# Patient Record
Sex: Female | Born: 1935 | Race: White | Hispanic: No | Marital: Married
Health system: Southern US, Community
[De-identification: ages and names within clinical notes are randomized; demographics above are authoritative.]

---

## 1999-03-18 ENCOUNTER — Encounter: Admission: RE | Admit: 1999-03-18 | Discharge: 1999-03-18 | Payer: Self-pay | Admitting: Neurosurgery

## 2003-07-24 ENCOUNTER — Other Ambulatory Visit: Payer: Self-pay

## 2004-06-09 ENCOUNTER — Ambulatory Visit: Payer: Self-pay | Admitting: Internal Medicine

## 2004-07-28 ENCOUNTER — Ambulatory Visit: Payer: Self-pay | Admitting: Ophthalmology

## 2004-07-28 ENCOUNTER — Other Ambulatory Visit: Payer: Self-pay

## 2004-08-02 ENCOUNTER — Ambulatory Visit: Payer: Self-pay | Admitting: Internal Medicine

## 2004-08-17 ENCOUNTER — Ambulatory Visit: Payer: Self-pay | Admitting: Ophthalmology

## 2005-07-21 ENCOUNTER — Ambulatory Visit: Payer: Self-pay | Admitting: Neurology

## 2006-06-02 ENCOUNTER — Other Ambulatory Visit: Payer: Self-pay

## 2006-06-02 ENCOUNTER — Inpatient Hospital Stay: Payer: Self-pay | Admitting: Unknown Physician Specialty

## 2006-06-04 ENCOUNTER — Other Ambulatory Visit: Payer: Self-pay

## 2006-06-07 ENCOUNTER — Encounter: Payer: Self-pay | Admitting: Internal Medicine

## 2006-06-14 ENCOUNTER — Encounter: Payer: Self-pay | Admitting: Internal Medicine

## 2007-11-16 ENCOUNTER — Emergency Department: Payer: Self-pay | Admitting: Internal Medicine

## 2007-11-16 ENCOUNTER — Other Ambulatory Visit: Payer: Self-pay

## 2007-11-29 ENCOUNTER — Ambulatory Visit: Payer: Self-pay | Admitting: Internal Medicine

## 2009-04-27 ENCOUNTER — Inpatient Hospital Stay: Payer: Self-pay | Admitting: Internal Medicine

## 2009-05-03 ENCOUNTER — Inpatient Hospital Stay: Payer: Self-pay | Admitting: Internal Medicine

## 2009-09-10 ENCOUNTER — Inpatient Hospital Stay: Payer: Self-pay | Admitting: Internal Medicine

## 2009-12-25 IMAGING — CR DG CHEST 1V PORT
1 series · 1 of 1 positions shown · non-contrast
Comparison: none

REASON FOR EXAM: syncope
COMMENTS:

PROCEDURE:     DXR - DXR PORTABLE CHEST SINGLE VIEW  - November 16, 2007 [DATE]
RESULT:     Comparison: 06/06/2006

[view not recorded]
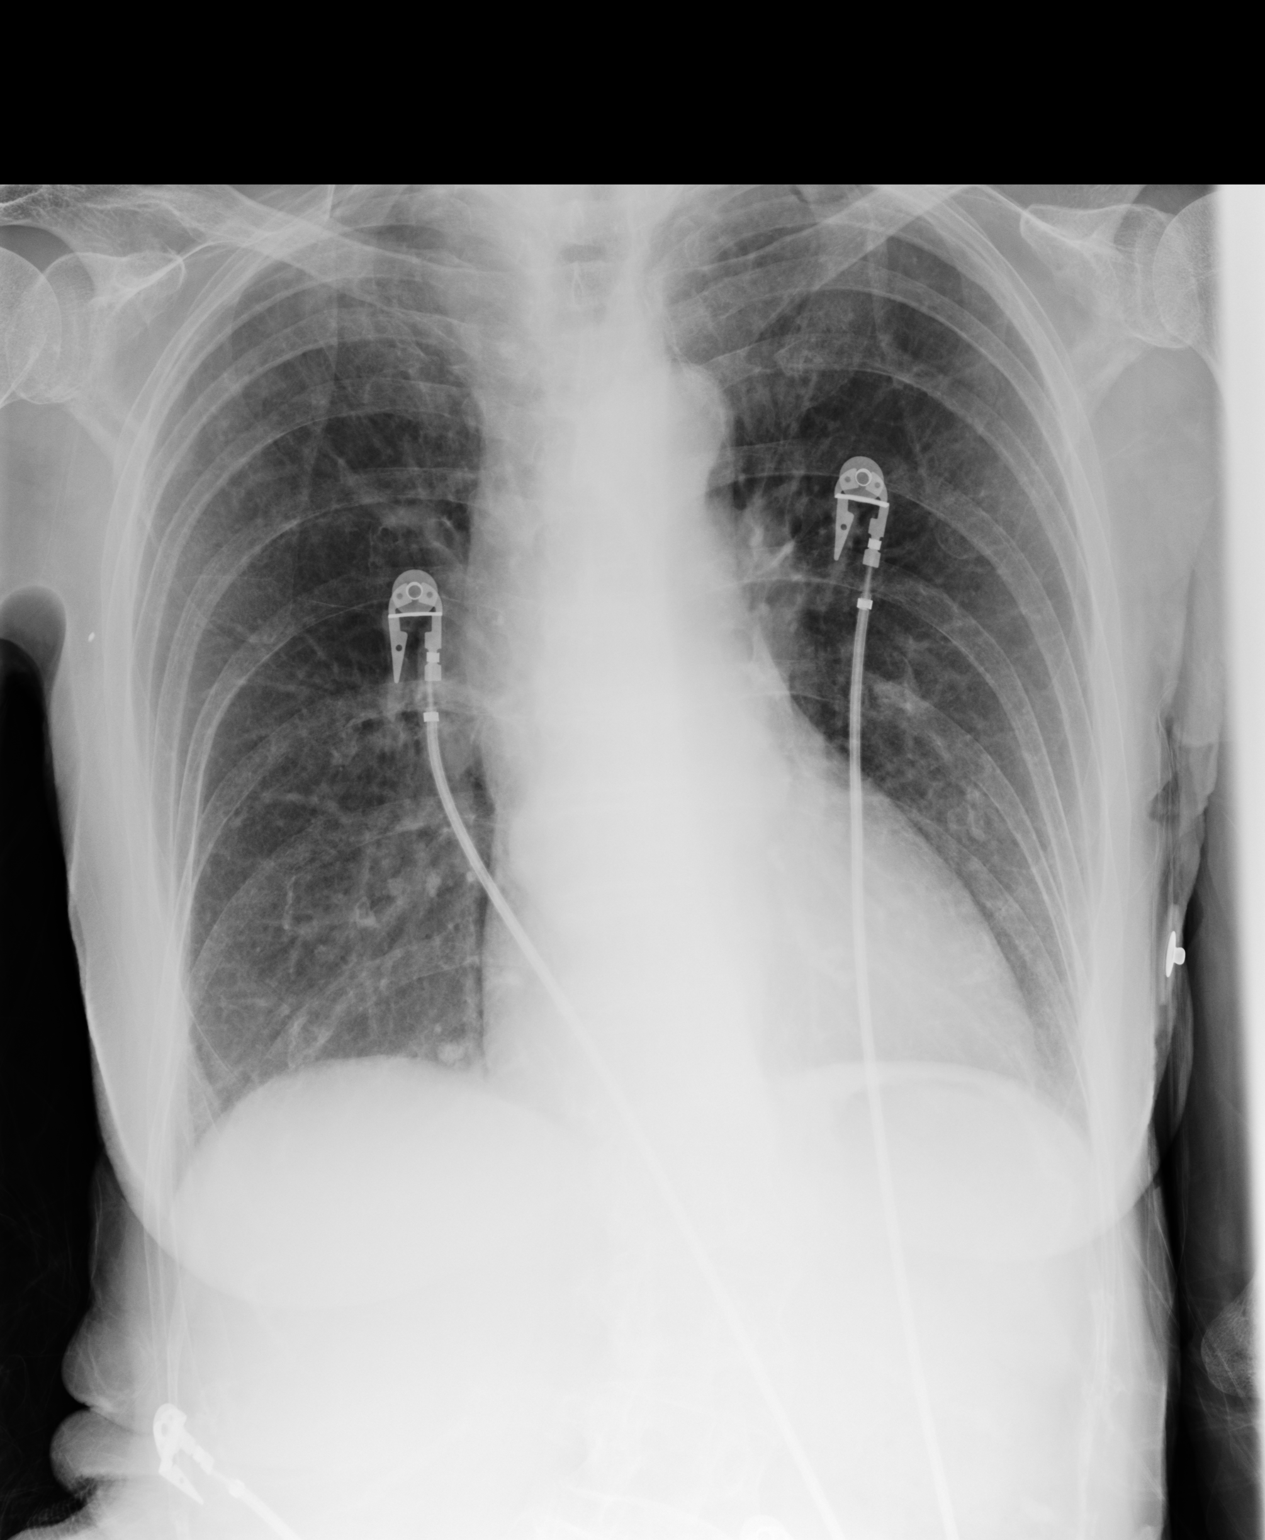

[1 of 1 positions shown; findings below may reference images not displayed]

FINDINGS: Single portable AP chest radiograph is provided. The lungs are mildly
hyperinflated. There is bilateral lower lung interstitial prominence likely
related to chronic interstitial disease. There is a pulmonary nodule in the
right medial lung base, given the density likely represents a calcified
pulmonary nodule. The nodule is unchanged compared to the prior exam. There
is no focal parenchymal opacity, pleural effusion, or pneumothorax. Normal
cardiomediastinal silhouette. The osseous structures are unremarkable.
IMPRESSION: No acute disease of the chest.

There is a pulmonary nodule in the right medial lung base, given the density
likely represents a calcified pulmonary nodule.  Recommend followup chest
radiograph in 6 months to document stability for a two-year period.

## 2010-08-22 ENCOUNTER — Ambulatory Visit: Payer: Self-pay

## 2012-06-28 ENCOUNTER — Emergency Department: Payer: Self-pay | Admitting: Emergency Medicine

## 2012-06-28 LAB — URINALYSIS, COMPLETE
Blood: NEGATIVE
Glucose,UR: NEGATIVE mg/dL (ref 0–75)
Ketone: NEGATIVE
Ph: 7 (ref 4.5–8.0)
Protein: NEGATIVE
RBC,UR: <1 /HPF (ref 0–5)
WBC UR: 1 /HPF (ref 0–5)

## 2012-06-28 LAB — COMPREHENSIVE METABOLIC PANEL
Albumin: 3.2 g/dL — ABNORMAL LOW (ref 3.4–5.0)
BUN: 20 mg/dL — ABNORMAL HIGH (ref 7–18)
Bilirubin,Total: 0.4 mg/dL (ref 0.2–1.0)
Calcium, Total: 9.1 mg/dL (ref 8.5–10.1)
Chloride: 105 mmol/L (ref 98–107)
Co2: 29 mmol/L (ref 21–32)
Creatinine: 1.09 mg/dL (ref 0.60–1.30)
Potassium: 4 mmol/L (ref 3.5–5.1)
SGOT(AST): 19 U/L (ref 15–37)
SGPT (ALT): 18 U/L (ref 12–78)
Sodium: 139 mmol/L (ref 136–145)
Total Protein: 6.8 g/dL (ref 6.4–8.2)

## 2012-06-28 LAB — CBC
HCT: 37.7 % (ref 35.0–47.0)
HGB: 12.4 g/dL (ref 12.0–16.0)
MCHC: 33 g/dL (ref 32.0–36.0)
MCV: 90 fL (ref 80–100)
RBC: 4.21 10*6/uL (ref 3.80–5.20)
RDW: 13.6 % (ref 11.5–14.5)
WBC: 6.2 10*3/uL (ref 3.6–11.0)

## 2013-01-13 ENCOUNTER — Inpatient Hospital Stay: Payer: Self-pay | Admitting: Family Medicine

## 2013-01-13 LAB — COMPREHENSIVE METABOLIC PANEL
Albumin: 2.5 g/dL — ABNORMAL LOW (ref 3.4–5.0)
Alkaline Phosphatase: 99 U/L
Anion Gap: 2 — ABNORMAL LOW (ref 7–16)
BUN: 19 mg/dL — ABNORMAL HIGH (ref 7–18)
Bilirubin,Total: 0.4 mg/dL (ref 0.2–1.0)
Calcium, Total: 8.7 mg/dL (ref 8.5–10.1)
Chloride: 97 mmol/L — ABNORMAL LOW (ref 98–107)
Co2: 31 mmol/L (ref 21–32)
Creatinine: 0.96 mg/dL (ref 0.60–1.30)
EGFR (African American): 60 — ABNORMAL LOW
EGFR (Non-African Amer.): 52 — ABNORMAL LOW
Glucose: 103 mg/dL — ABNORMAL HIGH (ref 65–99)
Osmolality: 263 (ref 275–301)
Potassium: 4.3 mmol/L (ref 3.5–5.1)
SGOT(AST): 39 U/L — ABNORMAL HIGH (ref 15–37)
SGPT (ALT): 36 U/L (ref 12–78)
Sodium: 130 mmol/L — ABNORMAL LOW (ref 136–145)
Total Protein: 6.2 g/dL — ABNORMAL LOW (ref 6.4–8.2)

## 2013-01-13 LAB — CBC WITH DIFFERENTIAL/PLATELET
Basophil #: 0.1 10*3/uL (ref 0.0–0.1)
Basophil %: 0.6 %
Eosinophil #: 0.3 10*3/uL (ref 0.0–0.7)
Eosinophil %: 2.8 %
HCT: 32.5 % — ABNORMAL LOW (ref 35.0–47.0)
HGB: 10.9 g/dL — ABNORMAL LOW (ref 12.0–16.0)
Lymphocyte #: 0.7 10*3/uL — ABNORMAL LOW (ref 1.0–3.6)
Lymphocyte %: 7.8 %
MCH: 30.4 pg (ref 26.0–34.0)
MCHC: 33.4 g/dL (ref 32.0–36.0)
MCV: 91 fL (ref 80–100)
Monocyte #: 0.9 x10 3/mm (ref 0.2–0.9)
Monocyte %: 9.3 %
Neutrophil #: 7.5 10*3/uL — ABNORMAL HIGH (ref 1.4–6.5)
Neutrophil %: 79.5 %
Platelet: 253 10*3/uL (ref 150–440)
RBC: 3.57 10*6/uL — ABNORMAL LOW (ref 3.80–5.20)
RDW: 13.5 % (ref 11.5–14.5)
WBC: 9.4 10*3/uL (ref 3.6–11.0)

## 2013-01-14 LAB — CBC WITH DIFFERENTIAL/PLATELET
Basophil #: 0.1 10*3/uL (ref 0.0–0.1)
Basophil %: 0.7 %
Eosinophil #: 0.3 10*3/uL (ref 0.0–0.7)
Eosinophil %: 4.3 %
HCT: 32.4 % — ABNORMAL LOW (ref 35.0–47.0)
HGB: 10.8 g/dL — ABNORMAL LOW (ref 12.0–16.0)
Lymphocyte #: 0.7 10*3/uL — ABNORMAL LOW (ref 1.0–3.6)
Lymphocyte %: 9 %
MCH: 30.7 pg (ref 26.0–34.0)
MCHC: 33.4 g/dL (ref 32.0–36.0)
MCV: 92 fL (ref 80–100)
Monocyte #: 0.9 x10 3/mm (ref 0.2–0.9)
Monocyte %: 11 %
Neutrophil #: 5.9 10*3/uL (ref 1.4–6.5)
Neutrophil %: 75 %
Platelet: 222 10*3/uL (ref 150–440)
RBC: 3.52 10*6/uL — ABNORMAL LOW (ref 3.80–5.20)
RDW: 13.2 % (ref 11.5–14.5)
WBC: 7.9 10*3/uL (ref 3.6–11.0)

## 2013-01-14 LAB — BASIC METABOLIC PANEL
Anion Gap: 6 — ABNORMAL LOW (ref 7–16)
BUN: 13 mg/dL (ref 7–18)
Calcium, Total: 8.5 mg/dL (ref 8.5–10.1)
Chloride: 101 mmol/L (ref 98–107)
Co2: 28 mmol/L (ref 21–32)
Creatinine: 0.83 mg/dL (ref 0.60–1.30)
EGFR (African American): >60
EGFR (Non-African Amer.): >60
Glucose: 98 mg/dL (ref 65–99)
Osmolality: 270 (ref 275–301)
Potassium: 3.9 mmol/L (ref 3.5–5.1)
Sodium: 135 mmol/L — ABNORMAL LOW (ref 136–145)

## 2013-01-15 LAB — CBC WITH DIFFERENTIAL/PLATELET
Basophil #: 0.1 10*3/uL (ref 0.0–0.1)
Basophil %: 0.7 %
Eosinophil #: 0.2 10*3/uL (ref 0.0–0.7)
Eosinophil %: 1.5 %
HCT: 33.8 % — ABNORMAL LOW (ref 35.0–47.0)
HGB: 11.4 g/dL — ABNORMAL LOW (ref 12.0–16.0)
Lymphocyte #: 0.6 10*3/uL — ABNORMAL LOW (ref 1.0–3.6)
Lymphocyte %: 5.4 %
MCH: 31 pg (ref 26.0–34.0)
MCHC: 33.8 g/dL (ref 32.0–36.0)
MCV: 92 fL (ref 80–100)
Monocyte #: 1 x10 3/mm — ABNORMAL HIGH (ref 0.2–0.9)
Monocyte %: 8.9 %
Neutrophil #: 9.1 10*3/uL — ABNORMAL HIGH (ref 1.4–6.5)
Neutrophil %: 83.5 %
Platelet: 246 10*3/uL (ref 150–440)
RBC: 3.68 10*6/uL — ABNORMAL LOW (ref 3.80–5.20)
RDW: 13.6 % (ref 11.5–14.5)
WBC: 10.9 10*3/uL (ref 3.6–11.0)

## 2013-01-15 LAB — BASIC METABOLIC PANEL
Anion Gap: 9 (ref 7–16)
BUN: 9 mg/dL (ref 7–18)
Calcium, Total: 8.3 mg/dL — ABNORMAL LOW (ref 8.5–10.1)
Chloride: 101 mmol/L (ref 98–107)
Co2: 25 mmol/L (ref 21–32)
Creatinine: 0.79 mg/dL (ref 0.60–1.30)
EGFR (African American): >60
EGFR (Non-African Amer.): >60
Glucose: 93 mg/dL (ref 65–99)
Osmolality: 268 (ref 275–301)
Potassium: 3.8 mmol/L (ref 3.5–5.1)
Sodium: 135 mmol/L — ABNORMAL LOW (ref 136–145)

## 2013-01-16 LAB — BASIC METABOLIC PANEL
Anion Gap: 4 — ABNORMAL LOW (ref 7–16)
BUN: 10 mg/dL (ref 7–18)
Chloride: 107 mmol/L (ref 98–107)
Creatinine: 0.84 mg/dL (ref 0.60–1.30)
EGFR (African American): >60
EGFR (Non-African Amer.): >60
Glucose: 98 mg/dL (ref 65–99)
Osmolality: 275 (ref 275–301)
Potassium: 3.5 mmol/L (ref 3.5–5.1)
Sodium: 138 mmol/L (ref 136–145)

## 2013-01-16 LAB — CBC WITH DIFFERENTIAL/PLATELET
Basophil #: 0.1 10*3/uL (ref 0.0–0.1)
Basophil %: 0.9 %
HGB: 10.5 g/dL — ABNORMAL LOW (ref 12.0–16.0)
Lymphocyte #: 0.7 10*3/uL — ABNORMAL LOW (ref 1.0–3.6)
Lymphocyte %: 8.4 %
MCH: 30.6 pg (ref 26.0–34.0)
MCV: 91 fL (ref 80–100)
Monocyte #: 1 x10 3/mm — ABNORMAL HIGH (ref 0.2–0.9)
Monocyte %: 11 %
Neutrophil #: 6.5 10*3/uL (ref 1.4–6.5)
Platelet: 220 10*3/uL (ref 150–440)

## 2013-01-16 LAB — CLOSTRIDIUM DIFFICILE(ARMC)

## 2013-01-16 LAB — OCCULT BLOOD X 1 CARD TO LAB, STOOL
Occult Blood, Feces: POSITIVE
Occult Blood, Feces: POSITIVE

## 2013-01-17 LAB — CBC WITH DIFFERENTIAL/PLATELET
Basophil #: 0.1 10*3/uL (ref 0.0–0.1)
Eosinophil #: 0.5 10*3/uL (ref 0.0–0.7)
Eosinophil %: 4.6 %
HCT: 32.7 % — ABNORMAL LOW (ref 35.0–47.0)
HGB: 10.9 g/dL — ABNORMAL LOW (ref 12.0–16.0)
Lymphocyte #: 0.8 10*3/uL — ABNORMAL LOW (ref 1.0–3.6)
MCH: 30.5 pg (ref 26.0–34.0)
MCHC: 33.4 g/dL (ref 32.0–36.0)
MCV: 91 fL (ref 80–100)
Monocyte #: 1 x10 3/mm — ABNORMAL HIGH (ref 0.2–0.9)
Monocyte %: 9.4 %
Neutrophil #: 8 10*3/uL — ABNORMAL HIGH (ref 1.4–6.5)
Neutrophil %: 77.2 %
Platelet: 219 10*3/uL (ref 150–440)
RDW: 13.9 % (ref 11.5–14.5)
WBC: 10.4 10*3/uL (ref 3.6–11.0)

## 2013-01-17 LAB — STOOL CULTURE

## 2013-01-18 LAB — CULTURE, BLOOD (SINGLE)

## 2013-02-23 ENCOUNTER — Emergency Department: Payer: Self-pay | Admitting: Emergency Medicine

## 2013-02-23 LAB — COMPREHENSIVE METABOLIC PANEL
ALBUMIN: 2.5 g/dL — AB (ref 3.4–5.0)
ALK PHOS: 138 U/L — AB
ALT: 34 U/L (ref 12–78)
ANION GAP: 2 — AB (ref 7–16)
AST: 43 U/L — AB (ref 15–37)
BILIRUBIN TOTAL: 0.4 mg/dL (ref 0.2–1.0)
BUN: 18 mg/dL (ref 7–18)
CO2: 32 mmol/L (ref 21–32)
CREATININE: 1 mg/dL (ref 0.60–1.30)
Calcium, Total: 8.8 mg/dL (ref 8.5–10.1)
Chloride: 103 mmol/L (ref 98–107)
EGFR (African American): 57 — ABNORMAL LOW
GFR CALC NON AF AMER: 49 — AB
GLUCOSE: 81 mg/dL (ref 65–99)
OSMOLALITY: 275 (ref 275–301)
Potassium: 4.8 mmol/L (ref 3.5–5.1)
Sodium: 137 mmol/L (ref 136–145)
Total Protein: 6.2 g/dL — ABNORMAL LOW (ref 6.4–8.2)

## 2013-02-23 LAB — CBC
HCT: 35.1 % (ref 35.0–47.0)
HGB: 11.4 g/dL — ABNORMAL LOW (ref 12.0–16.0)
MCH: 29 pg (ref 26.0–34.0)
MCHC: 32.5 g/dL (ref 32.0–36.0)
MCV: 89 fL (ref 80–100)
Platelet: 171 10*3/uL (ref 150–440)
RBC: 3.94 10*6/uL (ref 3.80–5.20)
RDW: 14.5 % (ref 11.5–14.5)
WBC: 6.7 10*3/uL (ref 3.6–11.0)

## 2013-02-23 LAB — URINALYSIS, COMPLETE
BACTERIA: NONE SEEN
Bilirubin,UR: NEGATIVE
Blood: NEGATIVE
Glucose,UR: NEGATIVE mg/dL (ref 0–75)
KETONE: NEGATIVE
LEUKOCYTE ESTERASE: NEGATIVE
NITRITE: NEGATIVE
PH: 6 (ref 4.5–8.0)
Protein: NEGATIVE
SPECIFIC GRAVITY: 1.009 (ref 1.003–1.030)
Squamous Epithelial: 1

## 2013-02-23 LAB — PROTIME-INR
INR: 1.1
PROTHROMBIN TIME: 14.4 s (ref 11.5–14.7)

## 2013-02-23 LAB — TROPONIN I: Troponin-I: 0.02 ng/mL

## 2013-03-16 DEATH — deceased

## 2014-06-05 NOTE — H&P (Signed)
PATIENT NAME:  Mandy Lane, Mandy Lane MR#:  [id number] DATE OF BIRTH:  Sep 23, 1919  DATE OF ADMISSION:  01/13/2013  CHIEF COMPLAINT: Bloody diarrhea and weakness.   HISTORY OF PRESENT ILLNESS: Mandy Lane is a 81 year old female with medical history significant for anemia with a baseline hemoglobin of 12, history of atrial fibrillation on aspirin and dementia, who presented to [facility] today for evaluation of diarrhea, bloody stool and generalized weakness. The patient is demented and lives in a nursing home. She is incontinent of bowel and bladder. Her nursing staff reports she has had two weeks of a small amount of blood in her stool. Today she was found to have a large bloody, bright red blood in her stool in her Depends. She has been more weak than usual. Appetite has been decreased. She has not had any nausea or vomiting. When asked directly, the patient states that she has had low back pain and middle back pain. She later complained of right upper quadrant abdominal pain. She generally is not very active and spends the majority of her time in a wheelchair. She has a history of hemorrhoids in the distant past. She does take an aspirin per day for prior history of atrial fibrillation. On presentation, the patient was found to be hypotensive with a sitting blood pressure of 90/60 and generalized weakness. Due to hypotension and lower GI bleed, the patient will be admitted for IV hydration, further evaluation and treatment, if need of anemia.   PAST MEDICAL HISTORY: 1.  History of degenerative arthritis.  2.  Osteoporosis.  3.  History of anemia,  last hemoglobin 12.2.  4.  Glaucoma.  5.  History of atrial fibrillation on aspirin.  6.  Anxiety.  7.  Dementia.   PAST SURGICAL HISTORY: 1.  Hemorrhoidectomy.  2.  Bilateral cataract surgeries.  3.  Surgical correction of right hip fracture in 2008.   CURRENT MEDICATIONS:  1.  Aspirin 325 mg 1 p.o. daily.  2.  Xalatan eyedrops at bedtime.  3.   Calcium plus vitamin D b.i.d.  4.  Cardizem CD 180 mg daily.  5.  Flonase two sprays per nostril once a day.  6.  Paxil 20 mg 1 p.o. at bedtime.   ALLERGIES: PENICILLIN, ARICEPT, ASPIRIN, BIAXIN AND SULFA.   SOCIAL HISTORY: The patient is demented, lives in nursing home. She is married. No tobacco history, does not drink alcohol.   CODE STATUS: The patient has no LIVING WILL in place. Her power of attorney is her son, Mandy Lane. She is DO NOT RESUSCITATE, DO NOT INTUBATE.   FAMILY HISTORY: Father deceased with pancreatic cancer. Her mother deceased at age 59 of cerebrovascular accident.   REVIEW OF SYSTEMS: All review of systems is from her family members as well as the patient when questioned directly.  GENERAL: Positive for weakness, fatigue. No fevers, chills.  HEENT: Denies nasal drainage, sore throat, visual changes.  CARDIAC: Denies chest pain, shortness of breath, dizziness, syncope, presyncope.  GASTROINTESTINAL: Per history of present illness; otherwise, denies nausea, vomiting.  GENITOURINARY: Denies dysuria or hematuria.  MUSCULOSKELETAL:  Denies joint pain stiffness, redness or swelling.  SKIN:  No change in skin.   PHYSICAL EXAMINATION: GENERAL: Elderly female in a wheelchair.  VITAL SIGNS: Weight 108, blood pressure 90/60, temperature 98.3, pulse 84 and irregular. Pulse oximetry is 88% on room air suggesting hypoxia.  HEENT: Pupils equal, round, reactive to light and accommodation. Conjunctivae are pale. Posterior pharynx is clear. Mucosa is slightly dry.  NECK: No cervical lymphadenopathy. No JVD noted.  HEART: Irregular but rate controlled with a 1/6 systolic murmur heard.  LUNGS: Diminished breath sounds throughout with some right basilar crackles heard.  ABDOMEN: Quiet bowel sounds soft, mildly tender to the right upper quadrant, nondistended. No rebound or guarding.  MUSCULOSKELETAL: No tenderness along the thoracic or lumbar vertebrae. Some tenderness in the  paraspinal muscles bilaterally.   IMPRESSION: 1.  Hypotension.  2.  Significant weakness.  3.  Lower gastrointestinal bleed.   4.  Dementia.   PLAN: 1.  The patient will be admitted for further evaluation of lower gastrointestinal bleed.  2.  We will obtain lab work including CBC Met-B.   3.  We will transfuse as needed.  4.  We will evaluate stool with Hemoccult, blood culture, stool, CNS, Clostridium difficile.  5. We will have gastroenterology evaluate. Given age and code status, unclear if she is a candidate for colonoscopy or other intervention. This will depend on symptoms and gastroenterology's opinion.  6.  We will treat with antibiotics, if need for possible diverticular bleed.  7.  We will have physical therapy evaluate for generalized weakness.   This patient was seen, re-examined, and all medical decisions were made by Dr. Dion Body.      ____________________________ Mandy Cradle, Lane-C mm:cc D: 01/13/2013 17:27:03 ET T: 01/13/2013 18:49:24 ET JOB#: [id number]  cc: Mandy Cradle, Lane-C, <Dictator> Mandy Lane ELECTRONICALLY SIGNED 01/14/2013 13:31

## 2014-06-05 NOTE — Consult Note (Signed)
Brief Consult Note: Diagnosis: hematochezia, anemia.   Patient was seen by consultant.   Consult note dictated.   Comments: Ms. Ardelle AntonWagoner is a pleasant caucasian female with hx of chronic anemia, AF & dementia was admitted with a 2 week hx of diarrhea & hematochezia on aspirin 81mg  QD.  Hgb stable since admission at 10.8, however baseline is 12.  She denies recent colonoscopy or EGD & daughter (healthcare POA, Rogelia BogaJanice Shoffner [phone])) is currently unavailable. She has had hypotension as well, but is currently normotensive.  Differentials include acute infectious colitis, ischemia, diverticular or hemorrhoidal bleeding, or colorectal carcinoma or polyp.  I will discuss family's desire for definitive diagnosis via colonoscopy & possibly EGD since pt has dementia & [place]  I will have teleconference with daughter at 10:15am.  Plan: 1) Agree with fll set of stool studies 2) Further recommendations to follow 3) monitor H/H 4) Agree with protonix BID  Thanks for consult.  Please see full dictated note. #[id number].  Electronic Signatures for Addendum Section:  Joselyn ArrowJones, Charls Custer L (NP) (Signed Addendum [phone]-Dec-14 20:52)  CT reviewed sigmoid colitis vs. diverticulitis.  Would treat with cipro/flagyl & FU stool studies.   Electronic Signatures: Joselyn ArrowJones, Vin Yonke L (NP)  (Signed 02-Dec-14 14:40)  Authored: Brief Consult Note   Last Updated: 02-Dec-14 20:52 by Joselyn ArrowJones, Keiosha Cancro L (NP)

## 2014-06-05 NOTE — Consult Note (Signed)
Chief Complaint:  Subjective/Chief Complaint Pt denies abdominal pain, diarrhea or rectal bleeding.  Eating well per family.  Denies N/V.   VITAL SIGNS/ANCILLARY NOTES: **Vital Signs.:   04-Dec-14 05:03  Vital Signs Type Routine  Temperature Temperature (F) 97.3  Celsius 36.2  Temperature Source oral  Pulse Pulse 81  Respirations Respirations 18  Systolic BP Systolic BP 867  Diastolic BP (mmHg) Diastolic BP (mmHg) 70  Mean BP 84  Pulse Ox % Pulse Ox % 91  Pulse Ox Activity Level  At rest  Oxygen Delivery Room Air/ 21 %   Brief Assessment:  GEN well developed, no acute distress, thin, Pleasantly confused.  A/Ox1.  Daughter in law @ bedside.   Cardiac Irregular   Respiratory normal resp effort   Gastrointestinal Normal   Gastrointestinal details normal Soft  Nontender  Nondistended   EXTR negative cyanosis/clubbing, negative edema   Additional Physical Exam Skin: pink, warm, dry   Lab Results: Routine Micro:  04-Dec-14 04:53   Micro Text Report CLOSTRIDIUM DIFFICILE   C.DIFFICILE ANTIGEN       C.DIFFICILE GDH ANTIGEN : POSITIVE   C.DIFFICILE TOXIN A/B     C.DIFFICILE TOXINS A AND B : NEGATIVE   PCR FOR TOXIGENIC C.DIFF  PCR FOR TOXIGENIC C.DIFFICILE : NEGATIVE   INTERPRETATION Negative for toxigenic  C. difficile. Toxin gene and active toxin production not detected. May be a nontoxigenic strain of C. difficile bacteria present, lacking the ability to produce toxin.    ANTIBIOTIC                        Routine Chem:  04-Dec-14 05:35   Glucose, Serum 98  BUN 10  Creatinine (comp) 0.84  Sodium, Serum 138  Potassium, Serum 3.5  Chloride, Serum 107  CO2, Serum 27  Calcium (Total), Serum  8.0  Anion Gap  4  Osmolality (calc) 275  eGFR (African American) >60  eGFR (Non-African American) >60 (eGFR values <12m/min/1.73 m2 may be an indication of chronic kidney disease (CKD). Calculated eGFR is useful in patients with stable renal function. The eGFR calculation  will not be reliable in acutely ill patients when serum creatinine is changing rapidly. It is not useful in  patients on dialysis. The eGFR calculation may not be applicable to patients at the low and high extremes of body sizes, pregnant women, and vegetarians.)  Routine Sero:  04-Dec-14 09:31   Occult Blood, Feces POSITIVE (Result(s) reported on 16 Jan 2013 at 10:30AM.)  Routine Hem:  04-Dec-14 05:35   WBC (CBC) 8.7  RBC (CBC)  3.42  Hemoglobin (CBC)  10.5  Hematocrit (CBC)  31.1  Platelet Count (CBC) 220  MCV 91  MCH 30.6  MCHC 33.7  RDW 13.2  Neutrophil % 74.5  Lymphocyte % 8.4  Monocyte % 11.0  Eosinophil % 5.2  Basophil % 0.9  Neutrophil # 6.5  Lymphocyte #  0.7  Monocyte #  1.0  Eosinophil # 0.5  Basophil # 0.1 (Result(s) reported on 16 Jan 2013 at 05:57AM.)   Assessment/Plan:  Assessment/Plan:  Assessment Acute diverticulitis/colitis: Much improved on cipro/flagyl.  No diarrhea.  No gross bleeding.   Plan 1) Complete 10 day course of cipro/flagyl 2) FU with PCP  Please call if you have any questions or concerns   Electronic Signatures: JAndria Meuse(NP)  (Signed 04-Dec-14 12:50)  Authored: Chief Complaint, VITAL SIGNS/ANCILLARY NOTES, Brief Assessment, Lab Results, Assessment/Plan   Last Updated: 04-Dec-14 12:50 by JVickey Huger  L (NP)

## 2014-06-05 NOTE — Consult Note (Signed)
PATIENT NAME:  KAIDANCE, PANTOJA MR#:  [id number] DATE OF BIRTH:  March 18, 1919  DATE OF CONSULTATION:  01/14/2013  REFERRING PHYSICIAN:  Marisue Ivan, MD CONSULTING PHYSICIAN:  Midge Minium, MD; Joselyn Arrow, NP PRIMARY CARE PHYSICIAN:  Marisue Ivan, MD  REASON FOR CONSULTATION: Lower GI bleed.   HISTORY OF PRESENT ILLNESS: Ms. Fry is a pleasant 81 year old Caucasian female who resides in a nursing home. She was admitted yesterday with reports of chronic diarrhea for at least 2 weeks with hematochezia. Yesterday, she had a large volume bright red blood found in her Depends by nursing staff. Most of the history was obtained from her family, including her daughter-in-law, daughter and son. They say that she has had a very poor appetite and is eating very little. She has lost about 14 pounds in the last 6 months. They deny any nausea or vomiting or complaints of abdominal pain. She has had multiple loose stools daily, reported through nursing staff. She does have a history of hemorrhoidal bleeding years ago. She has atrial fibrillation and is on aspirin 325 mg daily. She denies any heartburn, indigestion, dysphagia or odynophagia. Hemoglobin was 10.9 yesterday, is 10.8 today. Her baseline hemoglobin is 12. Last colonoscopy was reported probably at least 10 years ago per her daughter. Her daughter, who is health care power of attorney, is named Engineer, production. She had bleeding hemorrhoids with atrial fibrillation on aspirin 325 mg. She is currently on 81 mg of aspirin.  PAST MEDICAL AND SURGICAL HISTORY: Chronic anemia, atrial fibrillation, dementia, incontinence, degenerative arthritis, osteoporosis, scoliosis, glaucoma, anxiety, bilateral cataract repair, and right hip fracture with replacement in 2008.   MEDICATIONS PRIOR TO ADMISSION: Aspirin 81 mg daily, calcium 600 mg with vitamin D 200 units b.i.d., diltiazem hydrochloride CD 180 mg 1 capsule daily, fluticasone 50 mcg 2 sprays daily,  multivitamin daily, paroxetine 20 mg at bedtime, Xalatan 0.005% at bedtime.   ALLERGIES: BIAXIN CAUSES GI DISTRESS. PENICILLIN CAUSED RASH. SULFA CAUSED RASH. TRINALIN: GI DISTRESS. VEETIDS: GI DISTRESS.   FAMILY HISTORY: Significant for father deceased of pancreatic cancer. Mother had a CVA.   SOCIAL HISTORY: She is a retired  Visual merchandiser. She has 3 healthy children. Her health care power of attorney is her daughter, Rogelia Boga, who can be reached at phone number [phone]. Her son is power of attorney. She resides at nursing home. There is no tobacco, alcohol, or illicit drug use.   REVIEW OF SYSTEMS:    MUSCULOSKELETAL: She has chronic mid to lower back pain.   Otherwise, family states unremarkable, and the patient is unable to give a complete review of systems given her dementia.   PHYSICAL EXAMINATION: VITAL SIGNS: Temperature 97.7, pulse 73, respirations 18, blood pressure 108/65, O2 sat 92% on room air.  GENERAL: She is alert, pleasant, cooperative, oriented x 2, in no acute distress. Her daughter-in-law is at the bedside during the exam, as well as her daughter.  HEENT: Sclerae clear, anicteric. Conjunctivae pink. Oropharynx pink and moist without any lesions.  NECK: Supple without any mass or thyromegaly.  CHEST: Heart rate irregularly irregular.  LUNGS: Clear to auscultation bilaterally.  ABDOMEN: Positive bowel sounds x 4. No bruits auscultated. Abdomen is soft, nontender, nondistended, without palpable mass or hepatosplenomegaly. No rebound tenderness or guarding.  RECTAL: No significant external lesions visualized. She has good sphincter tone. No internal masses palpated. She has a moderate amount of medium brown stool obtained from the vault without any gross blood.  EXTREMITIES: Without edema, clubbing.  MUSCULOSKELETAL:  Good equal strength and movement bilaterally.  NEUROLOGIC: Grossly intact.  PSYCHIATRIC: She is pleasantly confused, at her baseline per her family.    LABORATORY STUDIES: Sodium 135, otherwise normal basic metabolic panel. Anion gap was 6. Total protein 6.2, albumin 2.5, AST 39, otherwise normal LFTs. White blood cell count 7.9, hematocrit 32.4, platelets 222. Blood cultures: No growth in 8 to 12 hours.   IMPRESSION: Ms. Ardelle AntonWagoner is a pleasant 81 year old Caucasian female with a history of chronic anemia, atrial fibrillation, and dementia who was admitted with a 2-week history of diarrhea and hematochezia on aspirin 81 mg daily. Her hemoglobin has been stable since admission at 10.8; however, her baseline is 12. No recent colonoscopy or EGD. I did discuss her care and management with her health care power of attorney, her daughter Rogelia BogaJanice Shoffner, her son and daughter-in-law. She has had some recent hypotension but is currently normotensive. I explained that differentials of her diarrhea and intermittent hematochezia could include acute infectious colitis, ischemia, diverticular bleeding or hemorrhoidal bleeding, or colorectal carcinoma or polyps. I offered colonoscopy by Dr. Servando SnareWohl. However, given her multiple comorbidities and overall declining health status with dementia, her family really wants to avoid invasive procedures. I offered a CT scan of abdomen and pelvis to further investigate her 14-pound weight loss and colitis. They were in agreement with this.   PLAN: 1.  Follow up on the full set of stool studies.  2.  Agree with Protonix b.i.d.  3.  CT scan of the abdomen and pelvis with IV and oral contrast.  4.  Continue supportive measures including IV fluids.  5.  Monitor hemoglobin and hematocrit.   Thank you for allowing us to participate in the care of  Ms. Mcquaid.  ____________________________ Joselyn ArrowKandice L. Eryca Bolte, NP klj:jcm D: 01/14/2013 14:39:31 ET T: 01/14/2013 15:09:15 ET JOB#: [id number]  cc: Joselyn ArrowKandice L. Darrold Bezek, NP, <Dictator> Marisue IvanKanhka Linthavong, MD Joselyn ArrowKANDICE L Briena Swingler FNP ELECTRONICALLY SIGNED 01/20/2013 15:44

## 2014-06-05 NOTE — Consult Note (Signed)
Chief Complaint:  Subjective/Chief Complaint Pt denies abdominal pain, diarrhea or rectal bleeding.  Denies N/V.   VITAL SIGNS/ANCILLARY NOTES: **Vital Signs.:   03-Dec-14 04:48  Vital Signs Type Q 4hr  Temperature Temperature (F) 98.2  Celsius 36.7  Temperature Source oral  Pulse Pulse 100  Respirations Respirations 18  Systolic BP Systolic BP 116  Diastolic BP (mmHg) Diastolic BP (mmHg) 68  Mean BP 84  Pulse Ox % Pulse Ox % 92  Pulse Ox Activity Level  At rest  Oxygen Delivery Room Air/ 21 %   Brief Assessment:  GEN well developed, no acute distress, thin, Pleasantly confused.  A/Ox1.  Daughter in law @ bedside.   Cardiac Irregular   Respiratory normal resp effort   Gastrointestinal Normal   Gastrointestinal details normal Soft  Nontender  Nondistended   EXTR negative cyanosis/clubbing, negative edema   Additional Physical Exam Skin: pink, warm, dry   Lab Results:  Routine Hem:  03-Dec-14 04:15   Monocyte #  1.0   Radiology Results: CT:    02-Dec-14 15:19, CT Abdomen and Pelvis With Contrast  CT Abdomen and Pelvis With Contrast   REASON FOR EXAM:    (1) persistent bloody diarrhea, anemia, wt loss; (2)   persistent bloody diarrhea,  COMMENTS:       PROCEDURE: CT  - CT ABDOMEN / PELVIS  W  - Jan 14 2013  3:19PM     CLINICAL DATA:  Persistent bloody diarrhea, anemia, weight loss    EXAM:  CT ABDOMEN AND PELVIS WITH CONTRAST    TECHNIQUE:  Multidetector CT imaging of the abdomen and pelvis was performed  using the standard protocol following bolus administration of  intravenous contrast. Sagittal and coronal MPR images reconstructed  from axial data set.    CONTRAST:  Dilute oral contrast.  100 cc Isovue 370 IV    COMPARISON:  None    FINDINGS:  Small bibasilar pleural effusions and adjacent atelectasis of the  lower lobes greater on right.    Moderate-sized hiatal hernia.    Scattered atherosclerotic calcifications aorta and  coronary  arteries.  Minimal right renal collecting system dilatation.    Questionable tiny low-attenuation focus laterally right lobe liver 7  mm diameter image 28 versus beam hardening artifacts secondary to  inclusion of patient's right arm in imaged field.    Remainder of liver, spleen, pancreas, kidneys, and adrenal glands  normal appearance.    Distended urinary bladder with a small left-sided diverticulum.    No bladder mass or definite wall thickening.    Portions of pelvis are obscured by beam hardening artifacts from  right hip prosthesis.  Stomach decompressed.    Small bowel loops unremarkable.    Scattered stool and contrast in colon.    Diverticulosis of the distal descending and sigmoid colon.    Questionable wall thickening of the distal sigmoid colon without  definite pericolic infiltrative changes, cannot exclude sigmoid  colitis or subtle diverticulitis.    Atrophic uterus with nonvisualization of ovaries and note of an  normal appendix.  No mass, adenopathy, free fluid, or free air.    Bones appear diffusely demineralized with scattered degenerative  disc disease changes.     IMPRESSION:  Wall thickening of the sigmoid colon with presence of numerous  sigmoid diverticula ; although no definite pericolic inflammatory  changes are seen, cannot exclude subtle sigmoid colitis or  diverticulitis.    Minimal dilatation of the right renal collecting system.    Bibasilar  pleural effusions and atelectasis.  Questionable subtle 7 mm low-attenuation focus right lobe liver  versus beam hardening artifact.    Moderate-sized hiatal hernia.      Electronically Signed    By: Ulyses SouthwardMark  Boles M.D.    On: 01/14/2013 18:08         Verified By: Lollie MarrowMARK A. BOLES, M.D.,   Assessment/Plan:  Assessment/Plan:  Assessment Acute diverticulitis/colitis: On cipro/flagyl doing well.  Hgb stable.  No gross bleeding. Small 7mm right hepatic nodule: LFTs essentially normal.   No further work-up wanted per family discussion at this point.   Plan 1) FU stool studies 2) Complete 10 day course of cipro/flagyl 3) PPI daily 4) Advanced to regular diet Please call if you have any questions or concerns   Electronic Signatures: Joselyn ArrowJones, Okechukwu Regnier L (NP)  (Signed 03-Dec-14 12:49)  Authored: Chief Complaint, VITAL SIGNS/ANCILLARY NOTES, Brief Assessment, Lab Results, Radiology Results, Assessment/Plan   Last Updated: 03-Dec-14 12:49 by Joselyn ArrowJones, Kohle Winner L (NP)

## 2014-06-05 NOTE — Discharge Summary (Signed)
PATIENT NAME:  Mandy Lane, Mandy Lane MR#:  [id number] DATE OF BIRTH:  [birth year]  DATE OF ADMISSION:  01/13/2013 DATE OF DISCHARGE:  01/17/2013   DISCHARGE DIAGNOSES:  1. Diverticulitis with acute gastrointestinal bleed.  2. Anemia.  3. Anxiety.  4. Dementia.  5. History of atrial fibrillation.   DISCHARGE MEDICATIONS:  1. Multivitamin 1 tab daily.  2. Xalatan 0.005% ophthalmic solution 1 drop to each eye at bedtime.  3. Fluticasone nasal 50 mcg 2 sprays to each nostril daily as needed.  4. Calcium 600/200 vitamin D 2 tabs p.o. daily.  5. Paroxetine 20 mg at bedtime.  6. Metronidazole 500 mg p.o. t.i.d. for 8 more days.  7. Acetaminophen 325 mg 2 tabs p.o. q.6 hours as needed for pain and fever.  8. Pantoprazole 40 mg p.o. daily.  9. Cipro 500 mg p.o. b.i.d. for 8 more days.   MEDICATIONS TO HOLD: Diltiazem and aspirin.   CONSULTS: GI.   PROCEDURES: The patient had a CT of the abdomen that showed wall thickening of the sigmoid colon with presence of numerous diverticula.   PERTINENT LABS ON DAY OF DISCHARGE: White blood cell count 10.4, hemoglobin 10.9 and platelets of 219.   BRIEF HOSPITAL COURSE:  1. Acute gastrointestinal bleed. The patient initially came in with acute gastrointestinal bleed with blood loss seen in the stools. Stool cultures were collected, but results are not available at this time. CT of the abdomen was performed on the patient and did show evidence of diverticula and possible wall thickening consistent with colitis and diverticulitis. The patient was treated therefore with Cipro and Flagyl, has 8 more days of treatment. No colonoscopy to be performed at this time. May follow up with GI if symptoms persist as an outpatient.  2. History of atrial fibrillation. The patient's blood pressure was initially low upon admission. Therefore, the diltiazem was held. Her blood pressure has improved, and her heart rate has remained stable, and she has been asymptomatic. Therefore,  will continue to hold the diltiazem at this time.  3. Other chronic issues remained stable.   FOLLOWUP: Will follow up via her health care provider at the facility. [facility] She will need PT and nursing care while she is there.   DISPOSITION: She is in stable condition to be discharged to assisted living facility.   ____________________________ Marisue IvanKanhka Jeancarlo Leffler, MD kl:lb D: 01/17/2013 08:13:36 ET T: 01/17/2013 08:38:03 ET JOB#: [id number]  cc: Marisue IvanKanhka Leanora Murin, MD, <Dictator> Marisue IvanKANHKA Nyriah Coote MD ELECTRONICALLY SIGNED 01/20/2013 9:58

## 2015-02-23 IMAGING — CT CT ABD-PELV W/ CM
2 of 5 series · 15 of 46 positions shown, 17 images · IV contrast (isovue)
Comparison: None

CLINICAL DATA: Persistent bloody diarrhea, anemia, weight loss

EXAM:
CT ABDOMEN AND PELVIS WITH CONTRAST
TECHNIQUE: Multidetector CT imaging of the abdomen and pelvis was performed
using the standard protocol following bolus administration of
intravenous contrast. Sagittal and coronal MPR images reconstructed
from axial data set.
CONTRAST:  Dilute oral contrast.  100 cc Isovue 370 IV

[Series 2: routine abd pel with · axial · 0.73mm/px · z∈[-924,-524]mm · 12 of 97 slices shown, 14 images]
[im 11/97  soft-tissue]
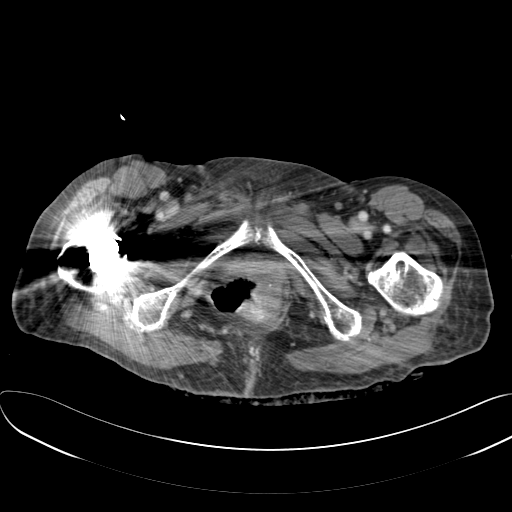
[im 11/97  bone]
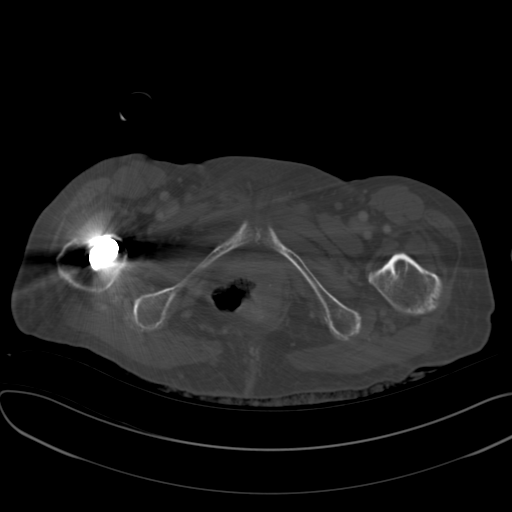
[im 21/97  soft-tissue]
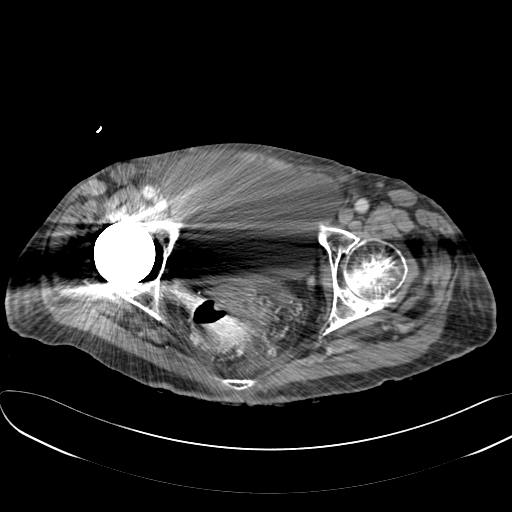
[im 26/97  soft-tissue]
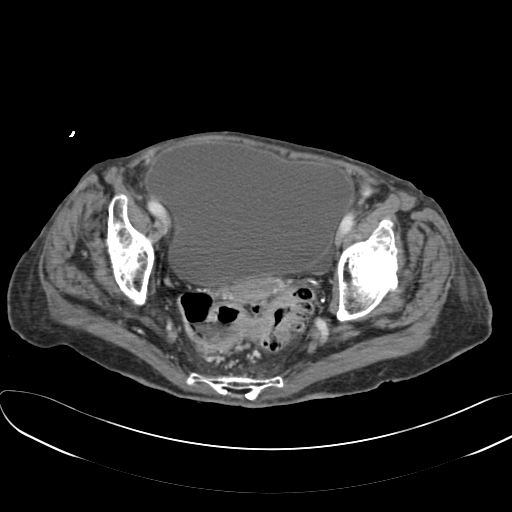
[im 31/97  soft-tissue]
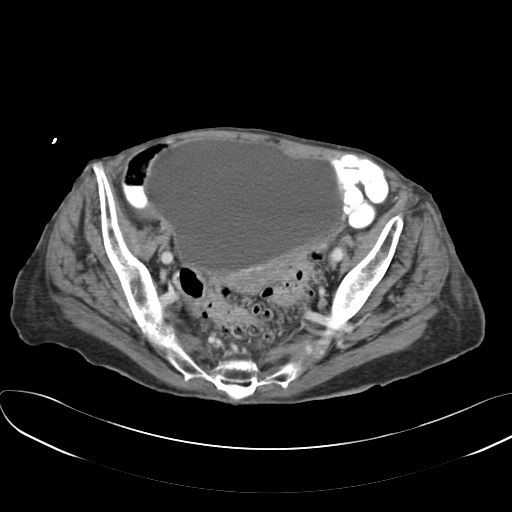
[im 41/97  soft-tissue]
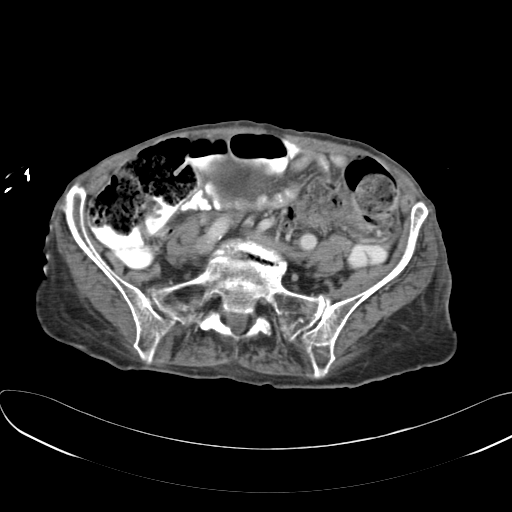
[im 46/97  soft-tissue]
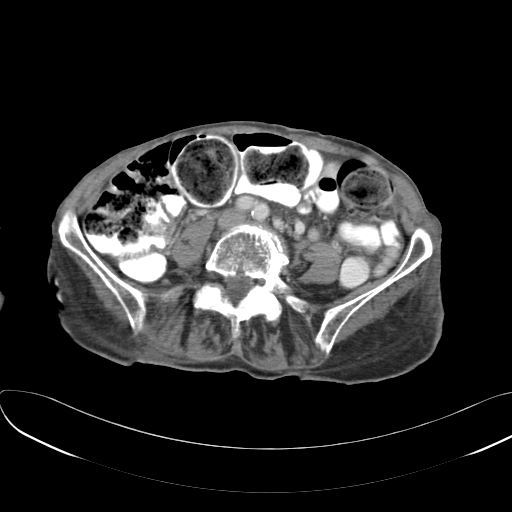
[im 56/97  soft-tissue]
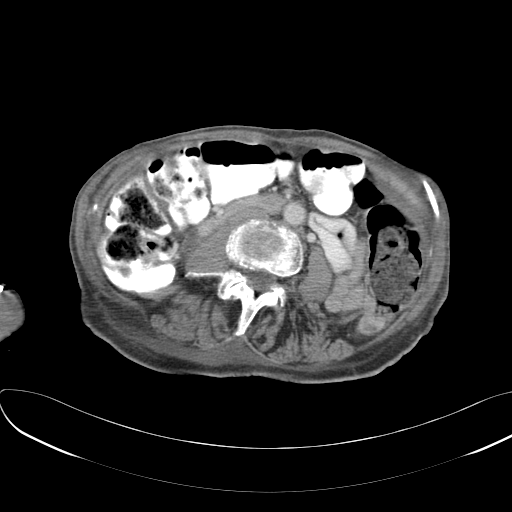
[im 61/97  soft-tissue]
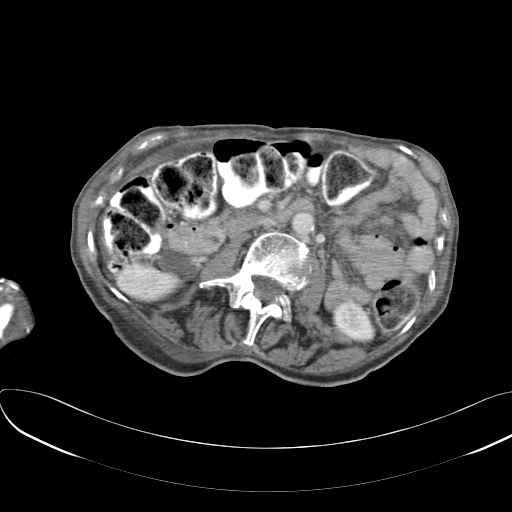
[im 71/97  soft-tissue]
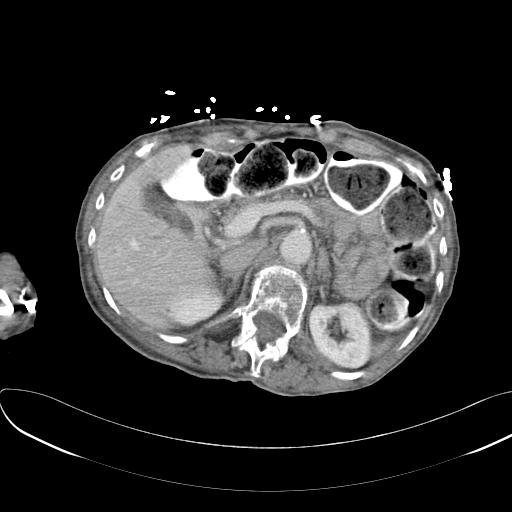
[im 71/97  bone]
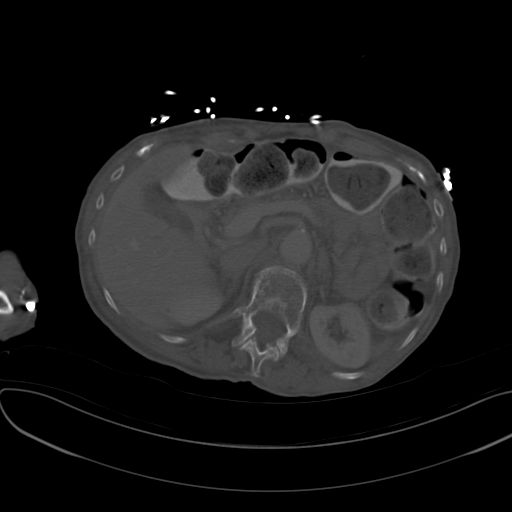
[im 76/97  soft-tissue]
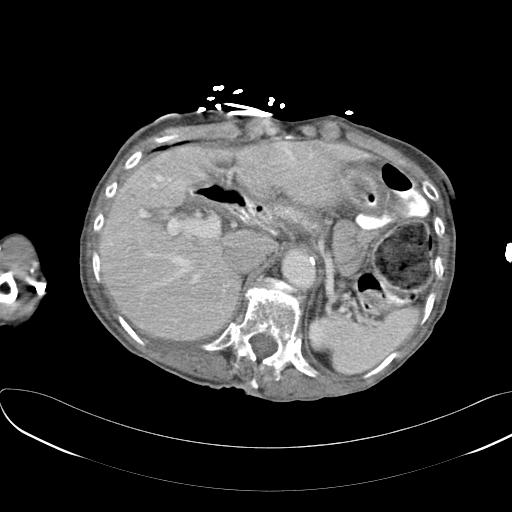
[im 81/97  soft-tissue]
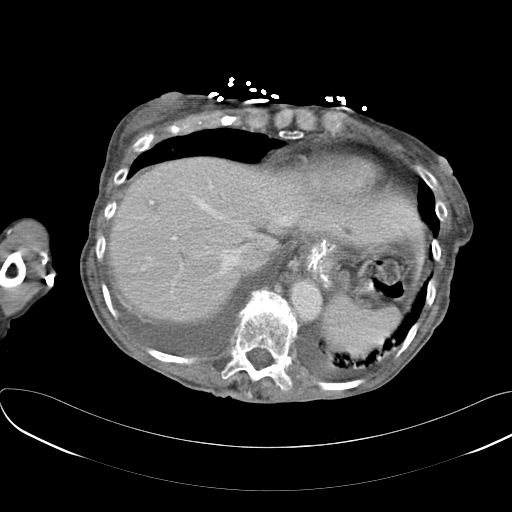
[im 91/97  soft-tissue]
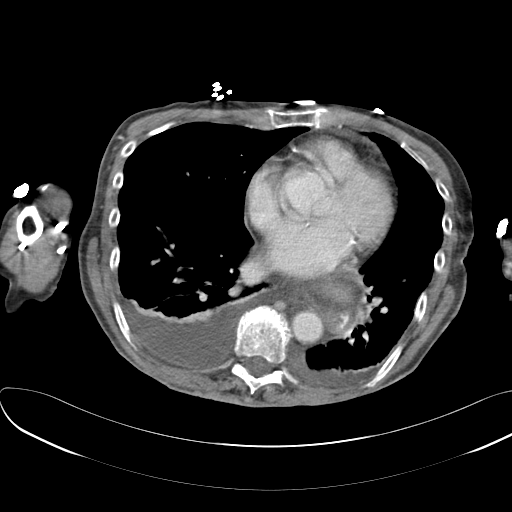

[Series 6: cor routine abd pel with · coronal · 0.62mm/px · 3 of 104 slices shown]
[im 35/104  soft-tissue]
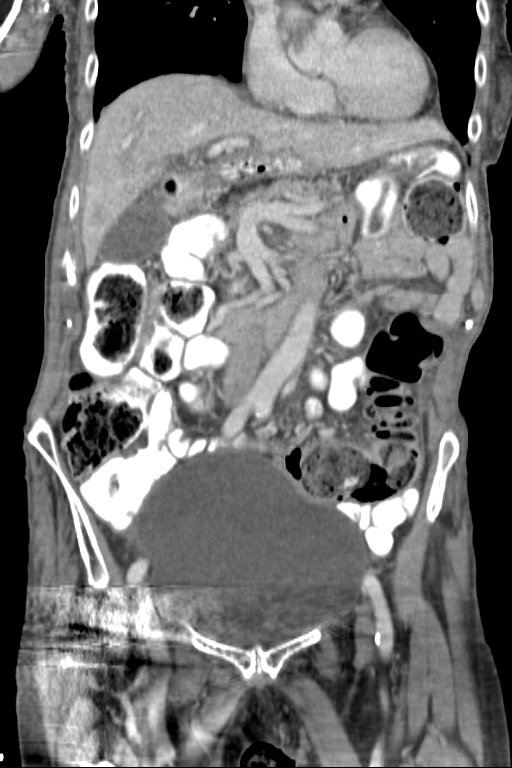
[im 46/104  soft-tissue]
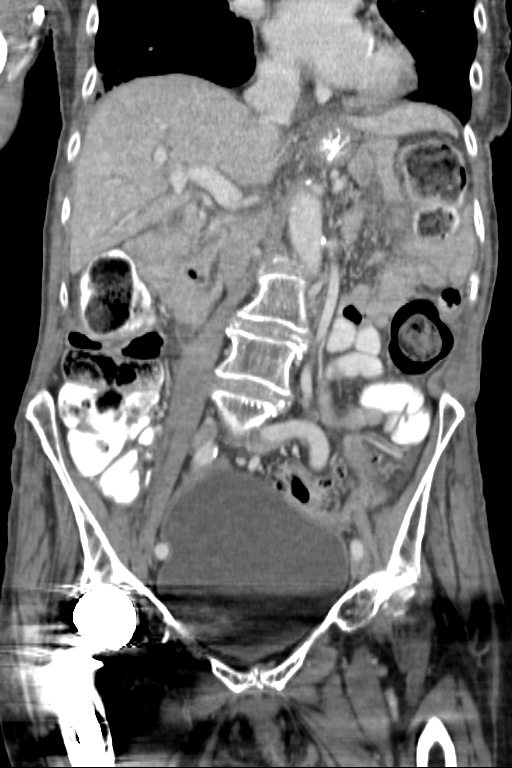
[im 58/104  soft-tissue]
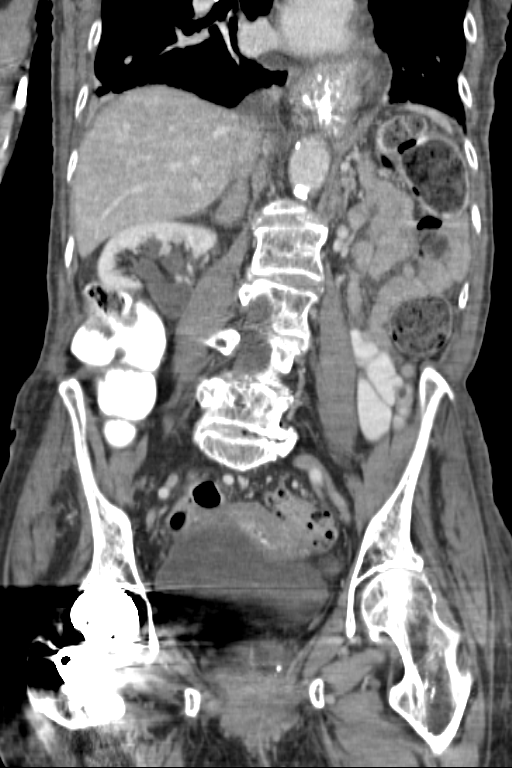

[15 of 46 positions shown; findings below may reference images not displayed]

FINDINGS: Small bibasilar pleural effusions and adjacent atelectasis of the
lower lobes greater on right.

Moderate-sized hiatal hernia.

Scattered atherosclerotic calcifications aorta and coronary
arteries.

Minimal right renal collecting system dilatation.

Questionable tiny low-attenuation focus laterally right lobe liver 7
mm diameter image 28 versus beam hardening artifacts secondary to
inclusion of patient's right arm in imaged field.

Remainder of liver, spleen, pancreas, kidneys, and adrenal glands
normal appearance.

Distended urinary bladder with a small left-sided diverticulum.

No bladder mass or definite wall thickening.

Portions of pelvis are obscured by beam hardening artifacts from
right hip prosthesis.

Stomach decompressed.

Small bowel loops unremarkable.

Scattered stool and contrast in colon.

Diverticulosis of the distal descending and sigmoid colon.

Questionable wall thickening of the distal sigmoid colon without
definite pericolic infiltrative changes, cannot exclude sigmoid
colitis or subtle diverticulitis.

Atrophic uterus with nonvisualization of ovaries and note of an
normal appendix.

No mass, adenopathy, free fluid, or free air.

Bones appear diffusely demineralized with scattered degenerative
disc disease changes.
IMPRESSION: Wall thickening of the sigmoid colon with presence of numerous
sigmoid diverticula ; although no definite pericolic inflammatory
changes are seen, cannot exclude subtle sigmoid colitis or
diverticulitis.

Minimal dilatation of the right renal collecting system.

Bibasilar pleural effusions and atelectasis.

Questionable subtle 7 mm low-attenuation focus right lobe liver
versus beam hardening artifact.

Moderate-sized hiatal hernia.
